# Patient Record
Sex: Male | Born: 1955 | Race: Black or African American | Hispanic: No | Marital: Single | Smoking: Current every day smoker
Health system: Southern US, Community
[De-identification: ages and names within clinical notes are randomized; demographics above are authoritative.]

## PROBLEM LIST (undated history)

## (undated) DIAGNOSIS — I10 Essential (primary) hypertension: Secondary | ICD-10-CM

## (undated) DIAGNOSIS — M25512 Pain in left shoulder: Secondary | ICD-10-CM

## (undated) DIAGNOSIS — T7840XA Allergy, unspecified, initial encounter: Secondary | ICD-10-CM

## (undated) DIAGNOSIS — M549 Dorsalgia, unspecified: Secondary | ICD-10-CM

## (undated) HISTORY — PX: HEMORRHOID SURGERY: SHX153

## (undated) HISTORY — DX: Pain in left shoulder: M25.512

## (undated) HISTORY — PX: CIRCUMCISION: SUR203

## (undated) HISTORY — DX: Allergy, unspecified, initial encounter: T78.40XA

---

## 2012-05-03 ENCOUNTER — Encounter (HOSPITAL_BASED_OUTPATIENT_CLINIC_OR_DEPARTMENT_OTHER): Payer: Self-pay

## 2012-05-03 ENCOUNTER — Emergency Department (HOSPITAL_BASED_OUTPATIENT_CLINIC_OR_DEPARTMENT_OTHER)
Admission: EM | Admit: 2012-05-03 | Discharge: 2012-05-03 | Disposition: A | Discharge status: Home / Self Care | Payer: Self-pay | Attending: Emergency Medicine | Admitting: Emergency Medicine

## 2012-05-03 DIAGNOSIS — R209 Unspecified disturbances of skin sensation: Secondary | ICD-10-CM | POA: Insufficient documentation

## 2012-05-03 DIAGNOSIS — F17200 Nicotine dependence, unspecified, uncomplicated: Secondary | ICD-10-CM | POA: Insufficient documentation

## 2012-05-03 DIAGNOSIS — M542 Cervicalgia: Secondary | ICD-10-CM | POA: Insufficient documentation

## 2012-05-03 MED ORDER — HYDROCODONE-ACETAMINOPHEN 5-325 MG PO TABS: 2.0000 | ORAL_TABLET | ORAL | Status: DC | PRN

## 2012-05-03 MED ORDER — KETOROLAC TROMETHAMINE 60 MG/2ML IM SOLN
60.0000 mg | Freq: Once | INTRAMUSCULAR | Status: AC
  Administered 2012-05-03: 60 mg via INTRAMUSCULAR
  Filled 2012-05-03: qty 2

## 2012-05-03 MED ORDER — METHOCARBAMOL 500 MG PO TABS: 500.0000 mg | ORAL_TABLET | Freq: Two times a day (BID) | ORAL | Status: DC

## 2012-05-03 MED ORDER — METHYLPREDNISOLONE SODIUM SUCC 125 MG IJ SOLR
125.0000 mg | Freq: Once | INTRAMUSCULAR | Status: AC
  Administered 2012-05-03: 125 mg via INTRAMUSCULAR
  Filled 2012-05-03: qty 2

## 2012-05-03 MED ORDER — IBUPROFEN 800 MG PO TABS: 800.0000 mg | ORAL_TABLET | Freq: Three times a day (TID) | ORAL | Status: DC

## 2012-05-03 NOTE — ED Provider Notes (Signed)
Medical screening examination/treatment/procedure(s) were performed by non-physician practitioner and as supervising physician I was immediately available for consultation/collaboration.   Hurman Horn, MD 05/03/12 [phone]

## 2012-05-03 NOTE — ED Notes (Signed)
Pt reports upper back pain radiating to left arm x 4 weeks. He also reports numbness in 4th and 5th digit of left hand.

## 2012-05-03 NOTE — ED Provider Notes (Signed)
History     CSN: [id number]  Arrival date & time 05/03/12  1123   First MD Initiated Contact with Patient 05/03/12 1234      Chief Complaint  Patient presents with  . Back Pain    (Consider location/radiation/quality/duration/timing/severity/associated sxs/prior treatment) Patient is a 57 y.o. male presenting with neck injury. The history is provided by the patient. No language interpreter was used.  Neck Injury This is a new problem. Episode onset: 6 weeks. The problem occurs constantly. Associated symptoms include neck pain. Nothing aggravates the symptoms. He has tried nothing for the symptoms.   Pt reports he has had neck pain for over a year.  Pt was seen at [facility] ED and advised to follow up with Orthopaedist.  Pt reports some decreased sensation left 4th adn 5th finger.  History reviewed. No pertinent past medical history.  History reviewed. No pertinent past surgical history.  No family history on file.  History  Substance Use Topics  . Smoking status: Current Every Day Smoker -- 0.50 packs/day    Types: Cigarettes  . Smokeless tobacco: Not on file  . Alcohol Use: Yes     Comment: weekends      Review of Systems  HENT: Positive for neck pain.   All other systems reviewed and are negative.    Allergies  Review of patient's allergies indicates no known allergies.  Home Medications   Current Outpatient Rx  Name  Route  Sig  Dispense  Refill  . HYDROcodone-acetaminophen (NORCO/VICODIN) 5-325 MG per tablet   Oral   Take 2 tablets by mouth every 4 (four) hours as needed.   16 tablet   0   . ibuprofen (ADVIL,MOTRIN) 800 MG tablet   Oral   Take 1 tablet (800 mg total) by mouth 3 (three) times daily.   21 tablet   0   . methocarbamol (ROBAXIN) 500 MG tablet   Oral   Take 1 tablet (500 mg total) by mouth 2 (two) times daily.   20 tablet   0     BP 145/73  Pulse 70  Temp(Src) 98.1 F (36.7 C) (Oral)  Resp 18  Ht 5\' 8"  (1.727 m)  Wt 138 lb  (62.596 kg)  BMI 20.99 kg/m2  SpO2 100%  Physical Exam  Nursing note and vitals reviewed. Constitutional: He is oriented to person, place, and time. He appears well-developed and well-nourished.  HENT:  Head: Normocephalic.  Right Ear: External ear normal.  Left Ear: External ear normal.  Nose: Nose normal.  Mouth/Throat: Oropharynx is clear and moist.  Eyes: Conjunctivae and EOM are normal. Pupils are equal, round, and reactive to light.  Neck: Normal range of motion. Neck supple.  Cardiovascular: Normal rate and normal heart sounds.   Pulmonary/Chest: Effort normal.  Abdominal: Soft.  Musculoskeletal: Normal range of motion.  Neurological: He is alert and oriented to person, place, and time. He has normal reflexes.  Skin: Skin is warm.  Psychiatric: He has a normal mood and affect.    ED Course  Procedures (including critical care time)  Labs Reviewed - No data to display No results found.   1. Neck pain on left side       MDM  Pt advised to call adult care clinic to be seen,   Pt given solumedrol and torodol Gerrianne Scale, PA-C 05/03/12 1356

## 2012-11-07 ENCOUNTER — Emergency Department (HOSPITAL_BASED_OUTPATIENT_CLINIC_OR_DEPARTMENT_OTHER)
Admission: EM | Admit: 2012-11-07 | Discharge: 2012-11-07 | Disposition: A | Discharge status: Home / Self Care | Payer: Self-pay | Attending: Emergency Medicine | Admitting: Emergency Medicine

## 2012-11-07 ENCOUNTER — Encounter (HOSPITAL_BASED_OUTPATIENT_CLINIC_OR_DEPARTMENT_OTHER): Payer: Self-pay | Admitting: Emergency Medicine

## 2012-11-07 DIAGNOSIS — R498 Other voice and resonance disorders: Secondary | ICD-10-CM | POA: Insufficient documentation

## 2012-11-07 DIAGNOSIS — Z791 Long term (current) use of non-steroidal anti-inflammatories (NSAID): Secondary | ICD-10-CM | POA: Insufficient documentation

## 2012-11-07 DIAGNOSIS — Z79899 Other long term (current) drug therapy: Secondary | ICD-10-CM | POA: Insufficient documentation

## 2012-11-07 DIAGNOSIS — T782XXA Anaphylactic shock, unspecified, initial encounter: Secondary | ICD-10-CM | POA: Insufficient documentation

## 2012-11-07 DIAGNOSIS — I10 Essential (primary) hypertension: Secondary | ICD-10-CM | POA: Insufficient documentation

## 2012-11-07 DIAGNOSIS — R21 Rash and other nonspecific skin eruption: Secondary | ICD-10-CM | POA: Insufficient documentation

## 2012-11-07 DIAGNOSIS — F17200 Nicotine dependence, unspecified, uncomplicated: Secondary | ICD-10-CM | POA: Insufficient documentation

## 2012-11-07 MED ORDER — EPINEPHRINE 0.3 MG/0.3ML IJ SOAJ: 0.3000 mg | INTRAMUSCULAR | Status: AC | PRN

## 2012-11-07 MED ORDER — EPINEPHRINE HCL 1 MG/ML IJ SOLN
INTRAMUSCULAR | Status: AC
  Administered 2012-11-07: 0.3 mg
  Filled 2012-11-07: qty 1

## 2012-11-07 MED ORDER — DIPHENHYDRAMINE HCL 50 MG/ML IJ SOLN
INTRAMUSCULAR | Status: AC
  Administered 2012-11-07: 50 mg
  Filled 2012-11-07: qty 1

## 2012-11-07 MED ORDER — SODIUM CHLORIDE 0.9 % IV BOLUS (SEPSIS)
1000.0000 mL | Freq: Once | INTRAVENOUS | Status: AC
  Administered 2012-11-07: 1000 mL via INTRAVENOUS

## 2012-11-07 MED ORDER — DIPHENHYDRAMINE HCL 25 MG PO TABS: 50.0000 mg | ORAL_TABLET | ORAL | Status: AC | PRN

## 2012-11-07 MED ORDER — FAMOTIDINE IN NACL 20-0.9 MG/50ML-% IV SOLN
20.0000 mg | Freq: Once | INTRAVENOUS | Status: DC
  Administered 2012-11-07: 20 mg via INTRAVENOUS

## 2012-11-07 MED ORDER — FAMOTIDINE IN NACL 20-0.9 MG/50ML-% IV SOLN
INTRAVENOUS | Status: AC
  Filled 2012-11-07: qty 50

## 2012-11-07 MED ORDER — EPINEPHRINE 0.3 MG/0.3ML IJ SOAJ
0.3000 mg | Freq: Once | INTRAMUSCULAR | Status: DC
  Filled 2012-11-07: qty 0.6

## 2012-11-07 MED ORDER — PREDNISONE 20 MG PO TABS: ORAL_TABLET | ORAL | Status: DC

## 2012-11-07 MED ORDER — METHYLPREDNISOLONE SODIUM SUCC 125 MG IJ SOLR
INTRAMUSCULAR | Status: AC
  Administered 2012-11-07: 125 mg
  Filled 2012-11-07: qty 2

## 2012-11-07 NOTE — ED Provider Notes (Signed)
CSN: [id number]     Arrival date & time 11/07/12  1104 History   First MD Initiated Contact with Patient 11/07/12 1106     Chief Complaint  Patient presents with  . Allergic Reaction   (Consider location/radiation/quality/duration/timing/severity/associated sxs/prior Treatment) HPI Comments: 57 year old male comes in with acute tongue swelling. States it started about 30 minutes prior to arrival. He denies any shortness of breath or wheezing. 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 He was working outside when this started. His significant other in the room states that this is the most severe reactions ever had. He denies any current medications or new foods. He was having itchiness and hives on his buttocks prior to arrival. No other systemic signs.   History reviewed. No pertinent past medical history. History reviewed. No pertinent past surgical history. History reviewed. No pertinent family history. History  Substance Use Topics  . Smoking status: Current Every Day Smoker -- 0.50 packs/day    Types: Cigarettes  . Smokeless tobacco: Not on file  . Alcohol Use: Yes     Comment: weekends    Review of Systems  Constitutional: Negative for fever and chills.  HENT: Positive for voice change.        Tongue swelling  Respiratory: Negative for chest tightness, shortness of breath and wheezing.   Cardiovascular: Negative for chest pain.  Gastrointestinal: Negative for vomiting.  Skin: Positive for rash (hives on buttocks).  All other systems reviewed and are negative.    Allergies  Review of patient's allergies indicates no known allergies.  Home Medications   Current Outpatient Rx  Name  Route  Sig  Dispense  Refill  . HYDROcodone-acetaminophen (NORCO/VICODIN) 5-325 MG per tablet   Oral   Take 2 tablets by mouth every 4 (four) hours as needed.   16 tablet   0   . ibuprofen (ADVIL,MOTRIN) 800 MG  tablet   Oral   Take 1 tablet (800 mg total) by mouth 3 (three) times daily.   21 tablet   0   . methocarbamol (ROBAXIN) 500 MG tablet   Oral   Take 1 tablet (500 mg total) by mouth 2 (two) times daily.   20 tablet   0    BP 153/68  Pulse 67  Resp 14  SpO2 100% Physical Exam  Nursing note and vitals reviewed. Constitutional: He is oriented to person, place, and time. He appears well-developed and well-nourished. No distress.  HENT:  Head: Normocephalic and atraumatic.  Right Ear: External ear normal.  Left Ear: External ear normal.  Nose: Nose normal.  Mouth/Throat: No trismus in the jaw.  Diffuse tongue and base of tongue swelling  Eyes: Right eye exhibits no discharge. Left eye exhibits no discharge.  Neck: Neck supple.  Cardiovascular: Normal rate, regular rhythm, normal heart sounds and intact distal pulses.   Pulmonary/Chest: Effort normal and breath sounds normal. No stridor. He has no wheezes.  Abdominal: Soft. There is no tenderness.  Musculoskeletal: He exhibits no edema.  Neurological: He is alert and oriented to person, place, and time.  Skin: Skin is warm and dry.    ED Course  Procedures (including critical care time) Labs Review Labs Reviewed - No data to display Imaging Review No results found.  EKG Interpretation   None       MDM   1. Anaphylaxis, initial encounter    Patient has neuropathy  on hand from neck problems so he feels he did not feel a bee sting but brushed a bee away just prior to this. Tongue returned to normal size after benadryl, solumedrol, pepcid, and epipen. He never had any throat sx or airway distress. Watched in ED 4 hours after epi and remained asymptomatic. Will d/c with benadryl, solumedrol and epipens (he is out). Discussed strict return precautions.    Audree Camel, MD 11/07/12 1710

## 2012-11-07 NOTE — ED Notes (Signed)
Pts tongue swelling states thinks a bee may have stung him but doesn't know for sure didn't feel it. 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

## 2012-11-07 NOTE — ED Notes (Signed)
Swelling is improving. Pt has no complaints.

## 2012-11-07 NOTE — ED Notes (Signed)
Family at bedside. 

## 2012-11-07 NOTE — ED Notes (Signed)
Pt. Is able to swallow and the Pt. Tongue is WNL at present time.  NO complaints from Pt.

## 2012-11-28 ENCOUNTER — Encounter: Payer: Self-pay | Admitting: Physician Assistant

## 2012-11-28 ENCOUNTER — Ambulatory Visit (INDEPENDENT_AMBULATORY_CARE_PROVIDER_SITE_OTHER): Payer: Self-pay | Admitting: Physician Assistant

## 2012-11-28 VITALS — BP 130/88 | HR 72 | Temp 98.5°F | Ht 68.0 in | Wt 139.0 lb

## 2012-11-28 DIAGNOSIS — M542 Cervicalgia: Secondary | ICD-10-CM

## 2012-11-28 DIAGNOSIS — N529 Male erectile dysfunction, unspecified: Secondary | ICD-10-CM

## 2012-11-28 DIAGNOSIS — Z0000 Encounter for general adult medical examination without abnormal findings: Secondary | ICD-10-CM

## 2012-11-28 DIAGNOSIS — M25519 Pain in unspecified shoulder: Secondary | ICD-10-CM

## 2012-11-28 DIAGNOSIS — M25512 Pain in left shoulder: Secondary | ICD-10-CM

## 2012-11-28 LAB — CBC WITH DIFFERENTIAL/PLATELET
Basophils Relative: 0 % (ref 0–1)
HCT: 46.1 % (ref 39.0–52.0)
Hemoglobin: 16.6 g/dL (ref 13.0–17.0)
Lymphs Abs: 2.3 10*3/uL (ref 0.7–4.0)
MCHC: 36 g/dL (ref 30.0–36.0)
Monocytes Absolute: 0.6 10*3/uL (ref 0.1–1.0)
Monocytes Relative: 8 % (ref 3–12)
Neutro Abs: 3.9 10*3/uL (ref 1.7–7.7)
Neutrophils Relative %: 56 % (ref 43–77)
RBC: 5.1 MIL/uL (ref 4.22–5.81)

## 2012-11-28 LAB — BASIC METABOLIC PANEL
BUN: 12 mg/dL (ref 6–23)
CO2: 27 mEq/L (ref 19–32)
Chloride: 99 mEq/L (ref 96–112)
Glucose, Bld: 96 mg/dL (ref 70–99)
Potassium: 4.5 mEq/L (ref 3.5–5.3)
Sodium: 135 mEq/L (ref 135–145)

## 2012-11-28 LAB — HEPATIC FUNCTION PANEL
ALT: 14 U/L (ref 0–53)
AST: 18 U/L (ref 0–37)
Albumin: 4.8 g/dL (ref 3.5–5.2)
Alkaline Phosphatase: 70 U/L (ref 39–117)
Total Bilirubin: 0.9 mg/dL (ref 0.3–1.2)
Total Protein: 7.8 g/dL (ref 6.0–8.3)

## 2012-11-28 LAB — TSH: TSH: 1.005 u[IU]/mL (ref 0.350–4.500)

## 2012-11-28 LAB — LIPID PANEL
Cholesterol: 158 mg/dL (ref 0–200)
LDL Cholesterol: 73 mg/dL (ref 0–99)
Total CHOL/HDL Ratio: 2.9 Ratio
VLDL: 30 mg/dL (ref 0–40)

## 2012-11-28 MED ORDER — CYCLOBENZAPRINE HCL 10 MG PO TABS: ORAL_TABLET | ORAL | Status: AC

## 2012-11-28 MED ORDER — NAPROXEN 500 MG PO TABS: 500.0000 mg | ORAL_TABLET | Freq: Two times a day (BID) | ORAL | Status: DC

## 2012-11-28 NOTE — Progress Notes (Signed)
Patient ID: Chad Beltran, male   DOB: 07-19-1955, 57 y.o.   MRN: [id number]  Patient presents to clinic today to establish care.  Acute Concerns: Back pain -- 2 years X-ray on back.  Sent to Orthopedist -- could not afford to see specialist.  Thoracic pain -- starts throbbing and builds to a sharp pain.  States pain is left-sided and causes numbness in his 4th/5th phalanges of left hand.  Patient also notes pain when trying to abduct his left arm.  Patient states he can rarely get his arm up over his head and has difficulty putting on/taking off shirts.  Patient also c/o erectile dysfunction that has been progressively worsening over the past few years.  Endorses difficulty achieving and maintaining erection sufficient enough for penetration. Patient is a curretn 0.5 ppd smoker.  Patient denies history of HTN, HLD, CAD, or diabetes.  Chronic Issues: Allergy to  [place] -- Patient had anaphylactic reaction earlier this year after being stung by a bee.  Was rushed to the ER.  Patient currently has 2 epi pens and keeps one nearby.  Health Maintenance: Dental -- overdue. Vision -- UTD Immunizations -- reports UTD.  Patient declines flu shot. Colonoscopy -- Patient overdue for colonoscopy.  Past Medical History  Diagnosis Date  . Allergy     Current Outpatient Prescriptions on File Prior to Visit  Medication Sig Dispense Refill  . diphenhydrAMINE (BENADRYL) 25 MG tablet Take 2 tablets (50 mg total) by mouth every 4 (four) hours as needed for itching.  20 tablet  0  . EPINEPHrine (EPIPEN) 0.3 mg/0.3 mL SOAJ injection Inject 0.3 mLs (0.3 mg total) into the muscle as needed.  2 Device  1  . ibuprofen (ADVIL,MOTRIN) 800 MG tablet Take 1 tablet (800 mg total) by mouth 3 (three) times daily.  21 tablet  0   No current facility-administered medications on file prior to visit.    No Known Allergies  Family History  Problem Relation Age of Onset  . Hypertension Mother   . Hypertension  Maternal Uncle     History   Social History  . Marital Status: Single    Spouse Name: N/A    Number of Children: N/A  . Years of Education: N/A   Social History Main Topics  . Smoking status: Current Every Day Smoker -- 0.50 packs/day for 25 years    Types: Cigarettes  . Smokeless tobacco: Never Used  . Alcohol Use: Yes     Comment: weekends  . Drug Use: No  . Sexual Activity: Yes   Other Topics Concern  . None   Social History Narrative  . None   Review of Systems  Constitutional: Negative for fever, weight loss and malaise/fatigue.  HENT: Negative for ear discharge, ear pain, hearing loss and tinnitus.   Eyes: Negative for blurred vision, double vision, photophobia and pain.  Respiratory: Negative for cough, shortness of breath and wheezing.   Cardiovascular: Negative for chest pain and palpitations.  Gastrointestinal: Negative for heartburn, nausea, vomiting, abdominal pain, diarrhea, constipation, blood in stool and melena.  Genitourinary: Negative for dysuria, urgency, frequency, hematuria and flank pain.  Musculoskeletal: Positive for back pain. Negative for falls.  Neurological: Negative for dizziness, seizures, loss of consciousness and headaches.  Endo/Heme/Allergies: Negative for environmental allergies.  Psychiatric/Behavioral: Negative for depression, suicidal ideas, hallucinations and substance abuse. The patient is not nervous/anxious and does not have insomnia.    Filed Vitals:   11/28/12 1018  BP: 130/88  Pulse: 72  Temp: 98.5 F (36.9 C)    Physical Exam  Vitals reviewed. Constitutional: He is oriented to person, place, and time and well-developed, well-nourished, and in no distress.  HENT:  Head: Normocephalic and atraumatic.  Right Ear: External ear normal.  Left Ear: External ear normal.  Nose: Nose normal.  Mouth/Throat: Oropharynx is clear and moist. No oropharyngeal exudate.  Tympanic membranes within normal limits bilaterally.  Eyes:  Conjunctivae and EOM are normal. Pupils are equal, round, and reactive to light.  Neck: Normal range of motion. Neck supple. No thyromegaly present.  Cardiovascular: Normal rate, regular rhythm, normal heart sounds and intact distal pulses.   Pulmonary/Chest: Effort normal and breath sounds normal. No respiratory distress. He has no wheezes. He has no rales. He exhibits no tenderness.  Abdominal: Soft. Bowel sounds are normal. He exhibits no distension and no mass. There is no tenderness. There is no rebound and no guarding.  Genitourinary: Penis normal.  Musculoskeletal:       Left shoulder: He exhibits decreased range of motion, tenderness, pain, spasm and decreased strength. He exhibits no bony tenderness, no swelling, no effusion, no crepitus, no deformity and normal pulse.       Left elbow: Normal.       Left wrist: Normal.       Cervical back: He exhibits normal range of motion, no tenderness, no bony tenderness, no swelling and no edema.       Left upper arm: He exhibits no tenderness, no bony tenderness, no swelling, no edema, no deformity and no laceration.       Left forearm: Normal.       Left hand: Normal.  Lymphadenopathy:    He has no cervical adenopathy.  Neurological: He is alert and oriented to person, place, and time. He has intact cranial nerves. No cranial nerve deficit.  Decreased strength of LUE noted on exam. Strength is 5/5 elsewhere.  Numbness of 4th/5th phalanx on left hand noted on exam.  Otherwise sensory wnl.  Skin: Skin is warm and dry. No rash noted.  Psychiatric: Affect normal.   Assessment/Plan: Visit for preventive health examination Will obtain fasting labs.  Will make referral to Gastroenterology for colonoscopy.  Neck pain on left side Dg cervical spine.  Patient given Naprosyn for Shoulder pain that should help with neck pain.  Pain in joint, shoulder region Naprosyn.  Flexeril at bedtime.  Referral to Dr. Katrinka Blazing for [place].  Erectile dysfunction Exam  WNL. Will obtain CBC, BMP, PSA.  Will begin trial of viagra pending normal results.

## 2012-11-28 NOTE — Progress Notes (Signed)
Pre visit review using our clinic review tool, if applicable. No additional management support is needed unless otherwise documented below in the visit note. 

## 2012-11-28 NOTE — Patient Instructions (Addendum)
Please obtain labs and imaging.  I will call you with your results.  Please take medication as prescribed for pain.  Please apply a topical salon pas patch to back to help with pain relief.  Please stop by front desk to schedule appointment with Dr. Katrinka Blazing.

## 2012-11-29 ENCOUNTER — Telehealth: Payer: Self-pay | Admitting: Physician Assistant

## 2012-11-29 ENCOUNTER — Encounter: Payer: Self-pay | Admitting: Gastroenterology

## 2012-11-29 DIAGNOSIS — M542 Cervicalgia: Secondary | ICD-10-CM | POA: Insufficient documentation

## 2012-11-29 DIAGNOSIS — N529 Male erectile dysfunction, unspecified: Secondary | ICD-10-CM | POA: Insufficient documentation

## 2012-11-29 DIAGNOSIS — Z0000 Encounter for general adult medical examination without abnormal findings: Secondary | ICD-10-CM | POA: Insufficient documentation

## 2012-11-29 DIAGNOSIS — M25519 Pain in unspecified shoulder: Secondary | ICD-10-CM | POA: Insufficient documentation

## 2012-11-29 LAB — URINALYSIS, ROUTINE W REFLEX MICROSCOPIC
Hgb urine dipstick: NEGATIVE
Ketones, ur: NEGATIVE mg/dL
Nitrite: NEGATIVE
Protein, ur: NEGATIVE mg/dL
Specific Gravity, Urine: 1.023 (ref 1.005–1.030)
Urobilinogen, UA: 1 mg/dL (ref 0.0–1.0)

## 2012-11-29 MED ORDER — SILDENAFIL CITRATE 50 MG PO TABS: 50.0000 mg | ORAL_TABLET | Freq: Every day | ORAL | Status: AC | PRN

## 2012-11-29 NOTE — Assessment & Plan Note (Signed)
Will obtain fasting labs.  Will make referral to Gastroenterology for colonoscopy.

## 2012-11-29 NOTE — Telephone Encounter (Signed)
Patient informed, understood & agreed; will call back to schedule f/u appt/SLS

## 2012-11-29 NOTE — Assessment & Plan Note (Signed)
Exam WNL. Will obtain CBC, BMP, PSA.  Will begin trial of viagra pending normal results.

## 2012-11-29 NOTE — Telephone Encounter (Signed)
Please inform patient that his labs look great.  I have no problem starting him on Viagra.  We will start on 50 mg of Viagra for patient to take 30 min - 2 hours before desired intercourse.  Patient is not to take more than 1 dose in a 24-hour period.  Common side effects of viagra can include -- headache, lowered blood pressure, dizziness, nausea.  If patient experiences an erection lasting for more than 4 hours, he needs to go to the ER.  (This is a rare side effect). Patient currently smokes less than 1/2 pack/ day.  He should be warned that smoking increases likelihood of side effects while taking Viagra, and can make the medicine less effective.  If patient achieves desired result with 50 mg, he can attempt to take a 1/2 tablet (25 mg) the next time to see if he still gets the desired effect with a lower dosage of medicine.  I want to see him in 1 month for follow-up.

## 2012-11-29 NOTE — Assessment & Plan Note (Addendum)
Dg cervical spine.  Patient given Naprosyn for Shoulder pain that should help with neck pain.

## 2012-11-29 NOTE — Assessment & Plan Note (Signed)
Naprosyn.  Flexeril at bedtime.  Referral to Dr. Katrinka Blazing for [place].

## 2012-11-29 NOTE — Addendum Note (Signed)
Addended by: Marcelline Mates on: 11/29/2012 09:02 AM   Modules accepted: Orders

## 2012-12-05 ENCOUNTER — Other Ambulatory Visit (INDEPENDENT_AMBULATORY_CARE_PROVIDER_SITE_OTHER): Payer: Self-pay

## 2012-12-05 ENCOUNTER — Ambulatory Visit (INDEPENDENT_AMBULATORY_CARE_PROVIDER_SITE_OTHER)
Admission: RE | Admit: 2012-12-05 | Discharge: 2012-12-05 | Disposition: A | Discharge status: Home / Self Care | Payer: Self-pay | Source: Ambulatory Visit | Attending: Family Medicine | Admitting: Family Medicine

## 2012-12-05 ENCOUNTER — Encounter: Payer: Self-pay | Admitting: Family Medicine

## 2012-12-05 ENCOUNTER — Ambulatory Visit (INDEPENDENT_AMBULATORY_CARE_PROVIDER_SITE_OTHER): Payer: Self-pay | Admitting: Family Medicine

## 2012-12-05 VITALS — BP 126/72 | HR 100 | Wt 147.0 lb

## 2012-12-05 DIAGNOSIS — M25512 Pain in left shoulder: Secondary | ICD-10-CM

## 2012-12-05 DIAGNOSIS — M25519 Pain in unspecified shoulder: Secondary | ICD-10-CM

## 2012-12-05 DIAGNOSIS — M5412 Radiculopathy, cervical region: Secondary | ICD-10-CM

## 2012-12-05 DIAGNOSIS — M5010 Cervical disc disorder with radiculopathy, unspecified cervical region: Secondary | ICD-10-CM | POA: Insufficient documentation

## 2012-12-05 MED ORDER — GABAPENTIN 100 MG PO CAPS: 300.0000 mg | ORAL_CAPSULE | Freq: Every day | ORAL | Status: AC

## 2012-12-05 MED ORDER — KETOROLAC TROMETHAMINE 60 MG/2ML IM SOLN
60.0000 mg | Freq: Once | INTRAMUSCULAR | Status: AC
  Administered 2012-12-05: 60 mg via INTRAMUSCULAR

## 2012-12-05 NOTE — Assessment & Plan Note (Signed)
Patient does have some cervical radiculopathy. X-rays were ordered, reviewed and interpreted by me today. Patient does have several degenerative disc on multiple levels from C3 all the way to T1. I do think that this is contributing likely is giving him his radiculopathy to the left shoulder. Patient did tolerate the procedure well with trigger point injections. Patient was given home exercise program which I think will be beneficial. Patient also will start gabapentin at night and will slowly increase. We likely will not do any during the day if possible. Patient will start a home exercise program a regular basis. Patient will come back again in 3 weeks for further evaluation. We did discuss with patient that he does not have insurance he may be able to get supplemental from [place], hospital. Patient unfortunately worsened over the course of time he may need further imaging. Hopefully he'll just continue to improve.

## 2012-12-05 NOTE — Progress Notes (Signed)
Pre-visit discussion using our clinic review tool. No additional management support is needed unless otherwise documented below in the visit note.  

## 2012-12-05 NOTE — Patient Instructions (Addendum)
Nice to meet you I think the pain is from your neck.  We will get xrays today. I think we need to know about it.  Call [facility] hospital and ask about [facility] card, they may help you with some tests.  Try nuerontin at night. Take 1 pill nightly for 3 nights, then 2 pills nightly for 3 nights and then 3 pills nightly therafter.  Do exercises daily  Come back in 3 weeks.

## 2012-12-05 NOTE — Addendum Note (Signed)
Addended by: Edwena Felty T on: 12/05/2012 01:15 PM   Modules accepted: Orders

## 2012-12-05 NOTE — Progress Notes (Signed)
I'm seeing this patient by the request  of:  Piedad Climes, PA-C  CC: Neck and left shoulder pain  HPI: Patient is a pleasant 57 year old gentleman coming in with neck and left shoulder pain. Patient has had this pain for quite some time but seems to be worsening over the course of the last couple months. 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 Patient unfortunately just lost his job last week. Patient states that the pain seems to always originate near his neck but does radiate to the left shoulder. Patient notices more pain when he moves his neck certain ways as well as her shoulder. Patient denies any radiation down the arm, denies any numbness or tingling but feels that this arm is getting somewhat week. Patient states that the pain is so severe that it is keeping him up at night. This is affecting all activities of daily living. Patient rates the pain a 9/10 on severity scale. Patient has been given muscle relaxers without any significant improvement.   Past medical, surgical, family and social history reviewed. Medications reviewed all in the electronic medical record.   Review of Systems: No headache, visual changes, nausea, vomiting, diarrhea, constipation, dizziness, abdominal pain, skin rash, fevers, chills, night sweats, weight loss, swollen lymph nodes, body aches, joint swelling, muscle aches, chest pain, shortness of breath, mood changes.   Objective:    Blood pressure 126/72, pulse 100, weight 147 lb (66.679 kg), SpO2 99.00%.   General: No apparent distress alert and oriented x3 mood and affect normal, dressed appropriately.  HEENT: Pupils equal, extraocular movements intact Respiratory: Patient's speak in full sentences and does not appear short of breath Cardiovascular: No lower extremity edema, non tender, no erythema Skin: Warm dry intact with no signs of infection or rash on extremities or on axial  skeleton. Abdomen: Soft nontender Neuro: Cranial nerves II through XII are intact, neurovascularly intact in all extremities with 2+ DTRs and 2+ pulses. Lymph: No lymphadenopathy of posterior or anterior cervical chain or axillae bilaterally.  Gait normal with good balance and coordination.  MSK: Non tender with full range of motion and good stability and symmetric strength and tone of elbows, wrist, hip, knee and ankles bilaterally.  Neck: Inspection shows patient does have some increased lordosis No palpable stepoffs. Positive Spurling's maneuver on the left. Patient has decreased range of motion with extension as well as left side bending Grip strength and sensation normal in bilateral hands Strength good C4 to T1 distribution No sensory change to C4 to T1 Negative Hoffman sign bilaterally Reflexes normal The patient does have significant tightness and muscle spasm with trigger points of the left trapezius muscle. 2 very large trigger points noted.  Shoulder: Left Inspection reveals no abnormalities, atrophy or asymmetry. Palpation is normal with no tenderness over AC joint or bicipital groove. ROM is full in all planes. Rotator cuff strength normal throughout. No signs of impingement with negative Neer and Hawkin's tests, empty can sign. Speeds and Yergason's tests normal. No labral pathology noted with negative Obrien's, negative clunk and good stability. Normal scapular function observed. No painful arc and no drop arm sign. No apprehension sign  Procedure note After verbal and written consent patient was prepped with alcohol swabs and a 26-gauge half inch needle was inserted in a trigger point of the left trapezius. Patient did have injection of 0.5% Marcaine one mL of Kenalog 40 mg/dL. Patient had this repeated in other trigger point.  Patient tolerated the procedure well with significant decrease in pain immediately.   Impression and Recommendations:     This case required  medical decision making of moderate complexity.

## 2012-12-31 ENCOUNTER — Ambulatory Visit (AMBULATORY_SURGERY_CENTER): Payer: Self-pay

## 2012-12-31 ENCOUNTER — Ambulatory Visit: Payer: Self-pay | Admitting: Gastroenterology

## 2012-12-31 VITALS — Ht 68.0 in | Wt 138.6 lb

## 2012-12-31 DIAGNOSIS — Z1211 Encounter for screening for malignant neoplasm of colon: Secondary | ICD-10-CM

## 2012-12-31 MED ORDER — MOVIPREP 100 G PO SOLR: ORAL | Status: DC

## 2013-01-14 ENCOUNTER — Encounter: Payer: Self-pay | Admitting: Gastroenterology

## 2013-01-14 ENCOUNTER — Ambulatory Visit (AMBULATORY_SURGERY_CENTER): Payer: Self-pay | Admitting: Gastroenterology

## 2013-01-14 VITALS — BP 107/62 | HR 67 | Temp 96.9°F | Resp 15 | Ht 68.0 in | Wt 138.0 lb

## 2013-01-14 DIAGNOSIS — D126 Benign neoplasm of colon, unspecified: Secondary | ICD-10-CM

## 2013-01-14 DIAGNOSIS — D128 Benign neoplasm of rectum: Secondary | ICD-10-CM

## 2013-01-14 DIAGNOSIS — Z1211 Encounter for screening for malignant neoplasm of colon: Secondary | ICD-10-CM

## 2013-01-14 MED ORDER — SODIUM CHLORIDE 0.9 % IV SOLN: 500.0000 mL | INTRAVENOUS | Status: DC

## 2013-01-14 NOTE — Patient Instructions (Signed)
Discharge instructions given with verbal understanding. Handouts on polyps and hemorrhoids. Resume previous medications. YOU HAD AN ENDOSCOPIC PROCEDURE TODAY AT THE [facility]: Refer to the procedure report that was given to you for any specific questions about what was found during the examination.  If the procedure report does not answer your questions, please call your gastroenterologist to clarify.  If you requested that your care partner not be given the details of your procedure findings, then the procedure report has been included in a sealed envelope for you to review at your convenience later.  YOU SHOULD EXPECT: Some feelings of bloating in the abdomen. Passage of more gas than usual.  Walking can help get rid of the air that was put into your GI tract during the procedure and reduce the bloating. If you had a lower endoscopy (such as a colonoscopy or flexible sigmoidoscopy) you may notice spotting of blood in your stool or on the toilet paper. If you underwent a bowel prep for your procedure, then you may not have a normal bowel movement for a few days.  DIET: Your first meal following the procedure should be a light meal and then it is ok to progress to your normal diet.  A half-sandwich or bowl of soup is an example of a good first meal.  Heavy or fried foods are harder to digest and may make you feel nauseous or bloated.  Likewise meals heavy in dairy and vegetables can cause extra gas to form and this can also increase the bloating.  Drink plenty of fluids but you should avoid alcoholic beverages for 24 hours.  ACTIVITY: Your care partner should take you home directly after the procedure.  You should plan to take it easy, moving slowly for the rest of the day.  You can resume normal activity the day after the procedure however you should NOT DRIVE or use heavy machinery for 24 hours (because of the sedation medicines used during the test).    SYMPTOMS TO REPORT  IMMEDIATELY: A gastroenterologist can be reached at any hour.  During normal business hours, 8:30 AM to 5:00 PM Monday through Friday, call [phone].  After hours and on weekends, please call the GI answering service at [phone] who will take a message and have the physician on call contact you.   Following lower endoscopy (colonoscopy or flexible sigmoidoscopy):  Excessive amounts of blood in the stool  Significant tenderness or worsening of abdominal pains  Swelling of the abdomen that is new, acute  Fever of 100F or higher  FOLLOW UP: If any biopsies were taken you will be contacted by phone or by letter within the next 1-3 weeks.  Call your gastroenterologist if you have not heard about the biopsies in 3 weeks.  Our staff will call the home number listed on your records the next business day following your procedure to check on you and address any questions or concerns that you may have at that time regarding the information given to you following your procedure. This is a courtesy call and so if there is no answer at the home number and we have not heard from you through the emergency physician on call, we will assume that you have returned to your regular daily activities without incident.  SIGNATURES/CONFIDENTIALITY: You and/or your care partner have signed paperwork which will be entered into your electronic medical record.  These signatures attest to the fact that that the information above on your After Visit Summary   has been reviewed and is understood.  Full responsibility of the confidentiality of this discharge information lies with you and/or your care-partner. 

## 2013-01-14 NOTE — Progress Notes (Signed)
Patient did not experience any of the following events: a burn prior to discharge; a fall within the facility; wrong site/side/patient/procedure/implant event; or a hospital transfer or hospital admission upon discharge from the facility. (G8907) Patient did not have preoperative order for IV antibiotic SSI prophylaxis. (G8918)  

## 2013-01-14 NOTE — Op Note (Signed)
[facility] [address] [place], [address]   COLONOSCOPY PROCEDURE REPORT  PATIENT: Chad Beltran, Chad Beltran  MR#: [id number] BIRTHDATE: Feb 25, 1955 , 57  yrs. old GENDER: Male ENDOSCOPIST: Rachael Fee, MD REFERRED BY: Piedad Climes, MD PROCEDURE DATE:  01/14/2013 PROCEDURE:   Colonoscopy with biopsy and snare polypectomy First Screening Colonoscopy - Avg.  risk and is 50 yrs.  old or older Yes.  Prior Negative Screening - Now for repeat screening. N/A  History of Adenoma - Now for follow-up colonoscopy & has been > or = to 3 yrs.  N/A  Polyps Removed Today? Yes. ASA CLASS:   Class II INDICATIONS:average risk screening. MEDICATIONS: Fentanyl 75 mcg IV, Versed 8 mg IV, and These medications were titrated to patient response per physician's verbal order DESCRIPTION OF PROCEDURE:   After the risks benefits and alternatives of the procedure were thoroughly explained, informed consent was obtained.  A digital rectal exam revealed no abnormalities of the rectum.   The LB [id number]  endoscope was introduced through the anus and advanced to the cecum, which was identified by both the appendix and ileocecal valve. No adverse events experienced.   The quality of the prep was excellent.  The instrument was then slowly withdrawn as the colon was fully examined.  COLON FINDINGS: Two polyps were found, removed and both were sent to pathology.  One was sessile, 2mm across, located in ascending segment, removed with forceps.  The other was 6mm across, pedunculated, located in rectume, removed with cold snare.  There were internal and external hemorrhoids.  The examination was otherwise normal.  Retroflexed views revealed no abnormalities. The time to cecum=3 minutes 17 seconds.  Withdrawal time=12 minutes 50 seconds.  The scope was withdrawn and the procedure completed. COMPLICATIONS: There were no complications.  ENDOSCOPIC IMPRESSION: Two polyps were found,  removed and both were sent to pathology. There were internal and external hemorrhoids. The examination was otherwise normal.  RECOMMENDATIONS: If the polyp(s) removed today are proven to be adenomatous (pre-cancerous) polyps, you will need a repeat colonoscopy in 5 years.  Otherwise you should continue to follow colorectal cancer screening guidelines for "routine risk" patients with colonoscopy in 10 years.  You will receive a letter within 1-2 weeks with the results of your biopsy as well as final recommendations.  Please call my office if you have not received a letter after 3 weeks.  eSigned:  Rachael Fee, MD 01/14/2013 9:02 AM

## 2013-01-15 ENCOUNTER — Telehealth: Payer: Self-pay | Admitting: *Deleted

## 2013-01-15 NOTE — Telephone Encounter (Signed)
  Follow up Call-  Call back number 01/14/2013  Post procedure Call Back phone  # [phone] cell  Permission to leave phone message Yes     Patient questions:  Do you have a fever, pain , or abdominal swelling? no Pain Score  0 *  Have you tolerated food without any problems? yes  Have you been able to return to your normal activities? yes  Do you have any questions about your discharge instructions: Diet   no Medications  no Follow up visit  no  Do you have questions or concerns about your Care? no  Actions: * If pain score is 4 or above: No action needed, pain <4.

## 2013-01-23 ENCOUNTER — Encounter: Payer: Self-pay | Admitting: Gastroenterology

## 2014-04-23 ENCOUNTER — Emergency Department (HOSPITAL_BASED_OUTPATIENT_CLINIC_OR_DEPARTMENT_OTHER)
Admission: EM | Admit: 2014-04-23 | Discharge: 2014-04-23 | Disposition: A | Discharge status: Home / Self Care | Payer: No Typology Code available for payment source | Attending: Emergency Medicine | Admitting: Emergency Medicine

## 2014-04-23 ENCOUNTER — Encounter (HOSPITAL_BASED_OUTPATIENT_CLINIC_OR_DEPARTMENT_OTHER): Payer: Self-pay | Admitting: *Deleted

## 2014-04-23 DIAGNOSIS — X58XXXA Exposure to other specified factors, initial encounter: Secondary | ICD-10-CM | POA: Insufficient documentation

## 2014-04-23 DIAGNOSIS — Z79899 Other long term (current) drug therapy: Secondary | ICD-10-CM | POA: Insufficient documentation

## 2014-04-23 DIAGNOSIS — Y998 Other external cause status: Secondary | ICD-10-CM | POA: Insufficient documentation

## 2014-04-23 DIAGNOSIS — Y929 Unspecified place or not applicable: Secondary | ICD-10-CM | POA: Insufficient documentation

## 2014-04-23 DIAGNOSIS — Z720 Tobacco use: Secondary | ICD-10-CM | POA: Insufficient documentation

## 2014-04-23 DIAGNOSIS — Y939 Activity, unspecified: Secondary | ICD-10-CM | POA: Insufficient documentation

## 2014-04-23 DIAGNOSIS — S025XXA Fracture of tooth (traumatic), initial encounter for closed fracture: Secondary | ICD-10-CM | POA: Insufficient documentation

## 2014-04-23 MED ORDER — OXYCODONE-ACETAMINOPHEN 7.5-325 MG PO TABS: 1.0000 | ORAL_TABLET | ORAL | Status: DC | PRN

## 2014-04-23 NOTE — ED Notes (Signed)
Pt c/o dental pain x 2 days.  

## 2014-04-23 NOTE — Discharge Instructions (Signed)
Dental Fracture You have a dental fracture or injury. This can mean the tooth is loose, has a chip in the enamel or is broken. If just the outer enamel is chipped, there is a good chance the tooth will not become infected. The only treatment needed may be to smooth off a rough edge. Fractures into the deeper layers (dentin and pulp) cause greater pain and are more likely to become infected. These require you to see a dentist as soon as possible to save the tooth. Loose teeth may need to be wired or bonded with a plastic splint to hold them in place. A paste may be painted on the open area of the broken tooth to reduce the pain. Antibiotics and pain medicine may be prescribed. Choosing a soft or liquid diet and rinsing the mouth out with warm water after meals may be helpful. See your dentist as recommended. Failure to seek care or follow up with a dentist or other specialist as recommended could result in the loss of your tooth, infection, or permanent dental problems. SEEK MEDICAL CARE IF:   You have increased pain not controlled with medicines.  You have swelling around the tooth, in the face or neck.  You have bleeding which starts, continues, or gets worse.  You have a fever. Document Released: 02/10/2004 Document Revised: 03/27/2011 Document Reviewed: 11/24/2008 [facility] Patient Information 2015 [place], [place]. This information is not intended to replace advice given to you by your health care provider. Make sure you discuss any questions you have with your health care provider.    Emergency Department Resource Guide 1) Find a Doctor and Pay Out of Pocket Although you won't have to find out who is covered by your insurance plan, it is a good idea to ask around and get recommendations. You will then need to call the office and see if the doctor you have chosen will accept you as a new patient and what types of options they offer for patients who are self-pay. Some doctors offer discounts or  will set up payment plans for their patients who do not have insurance, but you will need to ask so you aren't surprised when you get to your appointment.  2) Contact Your Local Health Department Not all health departments have doctors that can see patients for sick visits, but many do, so it is worth a call to see if yours does. If you don't know where your local health department is, you can check in your phone book. The CDC also has a tool to help you locate your state's health department, and many state websites also have listings of all of their local health departments.  3) Find a [facility] If your illness is not likely to be very severe or complicated, you may want to try a walk in clinic. These are popping up all over the country in pharmacies, drugstores, and shopping centers. They're usually staffed by nurse practitioners or physician assistants that have been trained to treat common illnesses and complaints. They're usually fairly quick and inexpensive. However, if you have serious medical issues or chronic medical problems, these are probably not your best option.  No Primary Care Doctor: - Call Health Connect at  [phone] - they can help you locate a primary care doctor that  accepts your insurance, provides certain services, etc. - Physician Referral Service- [phone]  Chronic Pain Problems: Organization         Address  Phone   Notes  [facility]  [phone] Patients need to be referred by their primary care doctor.   Medication Assistance: Organization         Address  Phone   Notes  [facility] Medication [facility] [address] [place], [place] [address] [phone] --Must be a resident of [facility] -- Must have NO insurance coverage whatsoever (no Medicaid/ Medicare, etc.) -- The pt. MUST have a primary care doctor that directs their care regularly and follows them in the community   [facility]  [phone]   [employer]  [phone]    Agencies that provide inexpensive medical care: Organization         Address  Phone   Notes  [place]  [phone]   [facility]    [phone]   [facility] [place], [place] [address] [phone]   [place] [address] [place] [phone]   Planned Parenthood    [phone]   [facility]    [phone]   [place] and [place] [address], [place] Phone:  [phone], Fax:  [phone] Hours of Operation:  9 am - 6 pm, M-F.  Also accepts Medicaid/Medicare and self-pay.  [facility] for [address], [address], [place] Phone: [phone], Fax: [phone]. Hours of Operation:  8:30 am - 5:30 pm, M-F.  Also accepts Medicaid and self-pay.  [facility] [address] [place] Phone: [phone]   [place], [place], [place] [phone], Ext. [phone] Mondays & Thursdays: 7-9 AM.  First 15 patients are seen on a first come, first serve basis.    [place] Providers:  Organization         Address  Phone   Notes  [facility] [address], [place] [phone] Also accepts self-pay patients.  [facility] [address], [place]  [phone]   [place], [address], [place] [phone]   [facility] [address] [place] [phone]   Lucianne Lei [address], [place]   [phone] Only accepts [place] Access [place] patients after they have their name applied to their card.   Self-Pay (no insurance) in [facility]:  Organization         Address  Phone   Notes  Sickle Cell Patients, [facility] [place] [phone]   [facility] [place] [phone]   [facility] [place]  [place], [place], [place] [phone]   [facility]/Dr. Osei-Bonsu  [address] [place] or [address], [place] [phone] Phone number for both [place] and [place] locations is the same.  [facility] and [facility] [address] [place] [phone]   [facility] [address], [place] or [address] [phone]   [facility] [address] [place] [phone], phone; [phone], fax Sees patients 1st and 3rd Saturday of every month.  Must not qualify for public or private insurance (i.e. Medicaid, Medicare, [place]  Choice, Veterans' Benefits)  Household income should be no more than 200% of the poverty level The clinic cannot treat you if you are pregnant or think you are pregnant  Sexually transmitted diseases are not treated at the clinic.    Dental Care: Organization         Address  Phone  Notes  [facility] [place] [phone] Accepts children up to age 58 who are enrolled in [place] or [place]; pregnant women with a Medicaid card; and children who have applied for Medicaid or [place] Health Choice, but were declined, whose parents can pay a reduced fee at time of service.  [facility] Department of [facility]  [address], [place] [phone] Accepts children up to age 37 who are enrolled in [place] or [place]; pregnant women with a Medicaid card; and children who have applied for Medicaid or [place] Health Choice, but were declined, whose parents can pay a reduced fee at time of service.  [place] Adult Dental Access PROGRAM  [place] [phone] Patients are seen by appointment only. Walk-ins are not accepted. [place] will see patients 61 years of age and older. Monday - Tuesday (8am-5pm) Most Wednesdays (8:30-5pm) $30 per visit, cash only  [facility] Adult Dental Access PROGRAM  [address], [facility] [phone] Patients are seen by appointment only. Walk-ins are not accepted. [place] will see patients 83 years of age and older. One Wednesday Evening (Monthly: Volunteer Based).  $30 per visit, cash only  [place]  [phone] for adults; Children under age 98, call Graduate Pediatric Dentistry at [phone]. Children aged 14-14, please call [phone] to request a pediatric application.  Dental services are provided in all areas of dental care including fillings, crowns and bridges, complete and partial dentures, implants, gum treatment, root canals, and extractions. Preventive care is also provided. Treatment is provided to both adults and children. Patients are selected via a lottery and there is often a waiting list.   [facility] [address] [place]  [phone] [email or web address]   [facility] [address] [place], [place] [phone], Ext. [phone] Second and Fourth Thursday of each month, opens at 6:30 AM; Clinic ends at 9 AM.  Patients are seen on a first-come first-served basis, and a limited number are seen during each clinic.   [facility]  [address] [place], [place] [phone]   Eligibility Requirements You must have lived in [place], [place], or [place] counties for at least the last three months.   You cannot be eligible for state or federal sponsored [employer], including [employer], [place], or [employer].   You generally cannot be eligible for healthcare insurance through your employer.    How to apply: Eligibility screenings are held every Tuesday and Wednesday afternoon  from 1:00 pm until 4:00 pm. You do not need an appointment for the interview!  [facility] [address] [place], [place]   [place]  [place]  [place]  [phone]

## 2014-04-23 NOTE — ED Notes (Signed)
MD at bedside. 

## 2014-04-23 NOTE — ED Provider Notes (Signed)
CSN: [id number]     Arrival date & time 04/23/14  2006 History  This chart was scribed for Leonard Schwartz, MD by Steva Colder, ED Scribe. The patient was seen in room MH07/MH07 at 8:13 PM.     Chief Complaint  Patient presents with  . Dental Pain      The history is provided by the patient. No language interpreter was used.    HPI Comments: Chad Beltran is a 59 y.o. male who presents to the Emergency Department complaining of left lower dental pain onset 2 days. He has not been to see a dentist because of financial issues. He denies facial swelling and any other symptoms. 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 Pt is not allergic to any medications.    Past Medical History  Diagnosis Date  . Allergy   . Left shoulder pain    Past Surgical History  Procedure Laterality Date  . Hemorrhoid surgery    . Circumcision     Family History  Problem Relation Age of Onset  . Hypertension Mother   . Hypertension Maternal Uncle   . Colon cancer Neg Hx   . Esophageal cancer Neg Hx   . Prostate cancer Neg Hx   . Rectal cancer Neg Hx   . Stomach cancer Neg Hx    History  Substance Use Topics  . Smoking status: Current Every Day Smoker -- 0.50 packs/day for 25 years    Types: Cigarettes  . Smokeless tobacco: Never Used  . Alcohol Use: 3.6 oz/week    6 Cans of beer per week     Comment: weekends    Review of Systems  Constitutional: Negative for fever and chills.  HENT: Positive for dental problem. Negative for facial swelling.   All other systems reviewed and are negative.     Allergies  Bee venom  Home Medications   Prior to Admission medications   Medication Sig Start Date End Date Taking? Authorizing Provider  cyclobenzaprine (FLEXERIL) 10 MG tablet Take 1 tablet at bedtime for muscle spasms. 11/28/12   Brunetta Jeans, PA-C  diphenhydrAMINE (BENADRYL) 25 MG tablet Take 2 tablets (50 mg total) by mouth every  4 (four) hours as needed for itching. 11/07/12   Sherwood Gambler, MD  EPINEPHrine (EPIPEN) 0.3 mg/0.3 mL SOAJ injection Inject 0.3 mLs (0.3 mg total) into the muscle as needed. 11/07/12   Sherwood Gambler, MD  gabapentin (NEURONTIN) 100 MG capsule Take 3 capsules (300 mg total) by mouth at bedtime. 12/05/12   Lyndal Pulley, DO  ibuprofen (ADVIL,MOTRIN) 800 MG tablet Take 1 tablet (800 mg total) by mouth 3 (three) times daily. 05/03/12   Fransico Meadow, PA-C  naproxen (NAPROSYN) 500 MG tablet Take 1 tablet (500 mg total) by mouth 2 (two) times daily with a meal. 11/28/12   Brunetta Jeans, PA-C  oxyCODONE-acetaminophen (PERCOCET) 7.5-325 MG per tablet Take 1 tablet by mouth every 4 (four) hours as needed for pain. 04/23/14   Leonard Schwartz, MD  sildenafil (VIAGRA) 50 MG tablet Take 1 tablet (50 mg total) by mouth daily as needed for erectile dysfunction. 11/29/12   Brunetta Jeans, PA-C   BP 145/95 mmHg  Pulse 65  Temp(Src) 98 F (36.7 C) (Oral)  Resp 18  Ht 5\' 8"  (5.053 m)  Wt 134 lb (60.782 kg)  BMI 20.38 kg/m2  SpO2 100%  Physical Exam  Constitutional: He is oriented to person,  place, and time. He appears well-developed and well-nourished. No distress.  HENT:  Head: Normocephalic and atraumatic.  Mouth/Throat:    Eyes: Pupils are equal, round, and reactive to light.  Neck: Normal range of motion.  Cardiovascular: Normal rate and intact distal pulses.   Pulmonary/Chest: No respiratory distress.  Abdominal: Normal appearance. He exhibits no distension.  Musculoskeletal: Normal range of motion.  Neurological: He is alert and oriented to person, place, and time. No cranial nerve deficit.  Skin: Skin is warm and dry. No rash noted.  Psychiatric: He has a normal mood and affect. His behavior is normal.  Nursing note and vitals reviewed.   ED Course  Procedures (including critical care time) DIAGNOSTIC STUDIES: Oxygen Saturation is 100% on RA, nl by my interpretation.     COORDINATION OF CARE: 8:16 PM-Discussed treatment plan which includes referral to dentist, pain medication, abx with pt at bedside and pt agreed to plan.   Labs Review Labs Reviewed - No data to display  Imaging Review No results found.    MDM   Final diagnoses:  Tooth fracture, closed, initial encounter    I personally performed the services described in this documentation, which was scribed in my presence. The recorded information has been reviewed and considered.   Leonard Schwartz, MD 04/23/14 2044

## 2014-06-30 IMAGING — CR DG CERVICAL SPINE COMPLETE 4+V
6 series · 6 of 6 positions shown · non-contrast
Comparison: None.

CLINICAL DATA: Left shoulder pain for 4-5 months

EXAM:
CERVICAL SPINE  4+ VIEWS

[view not recorded (1 of 6)]
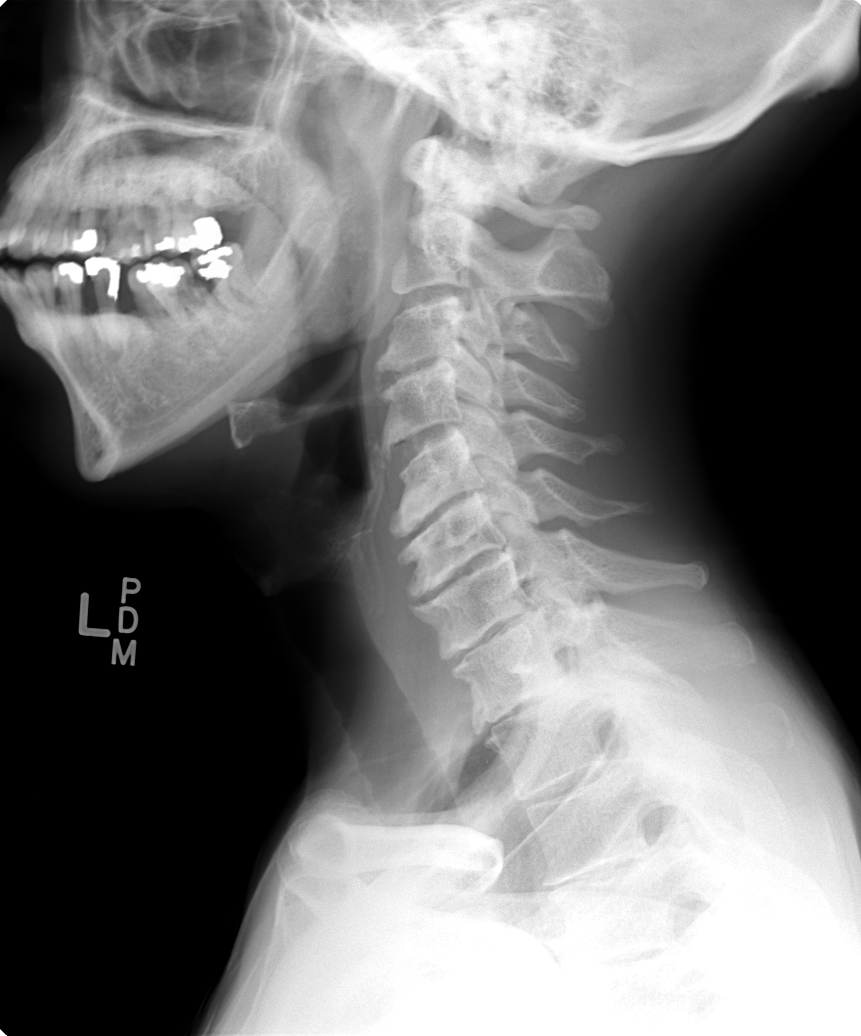

[view not recorded (2 of 6)]
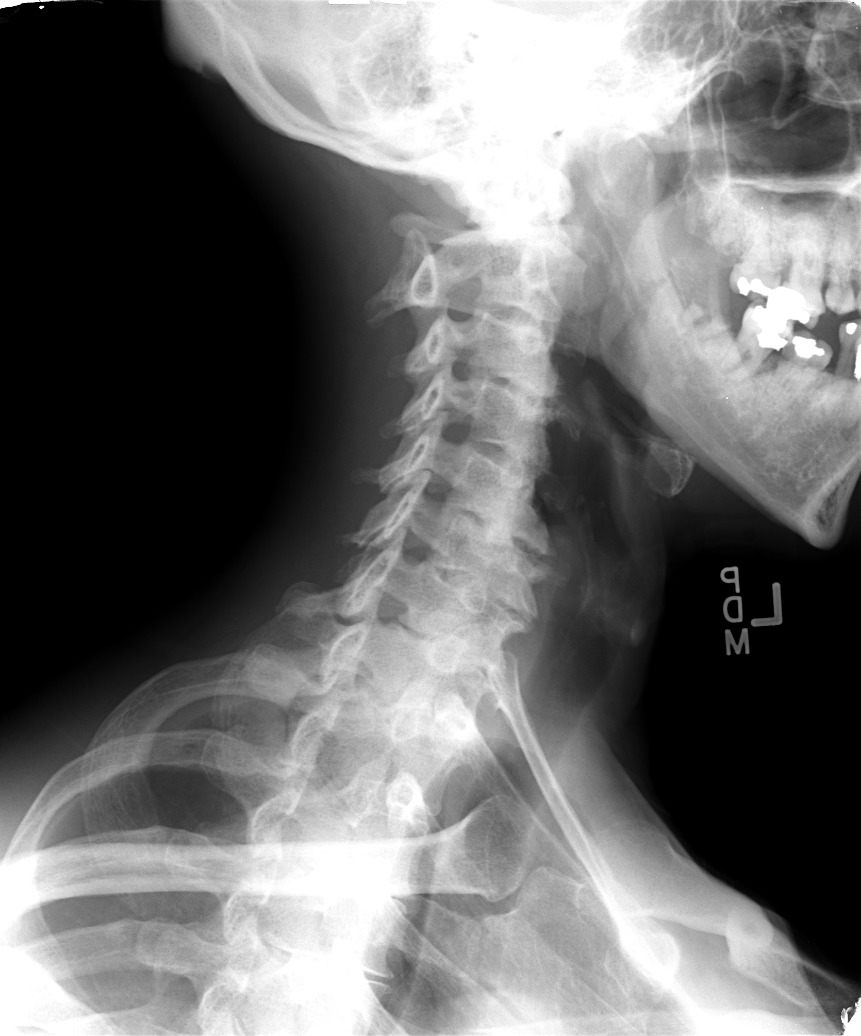

[view not recorded (3 of 6)]
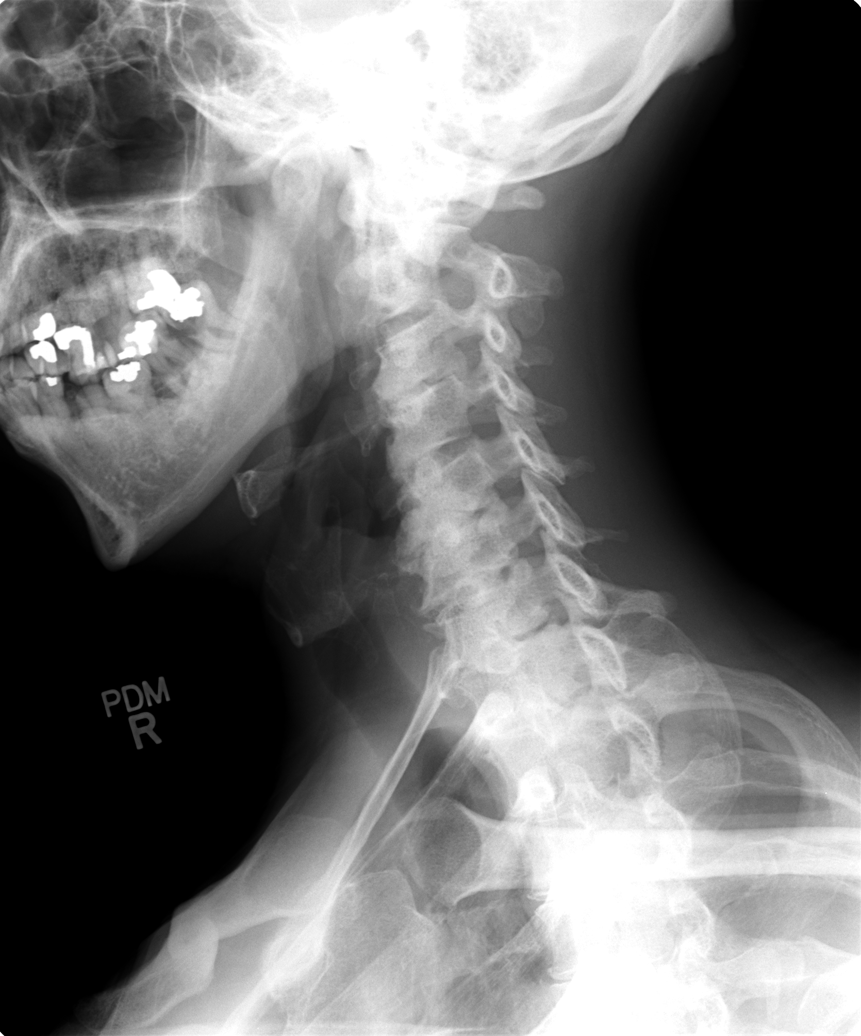

[view not recorded (4 of 6)]
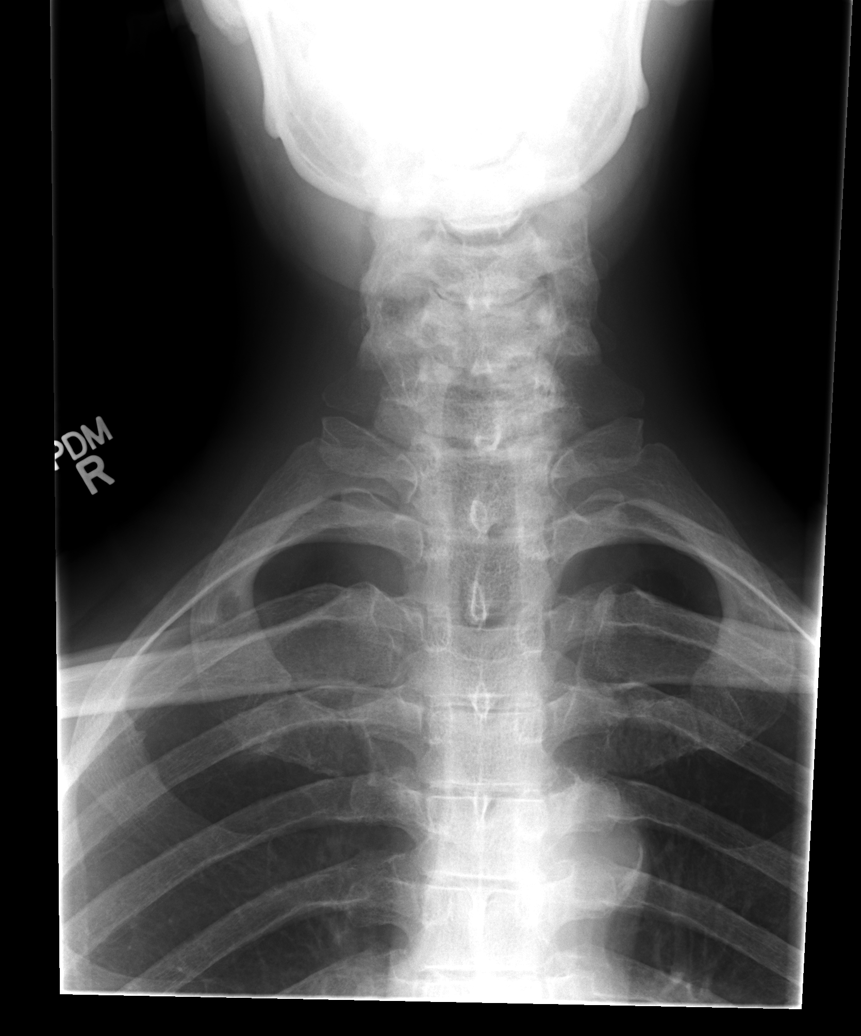

[view not recorded (5 of 6)]
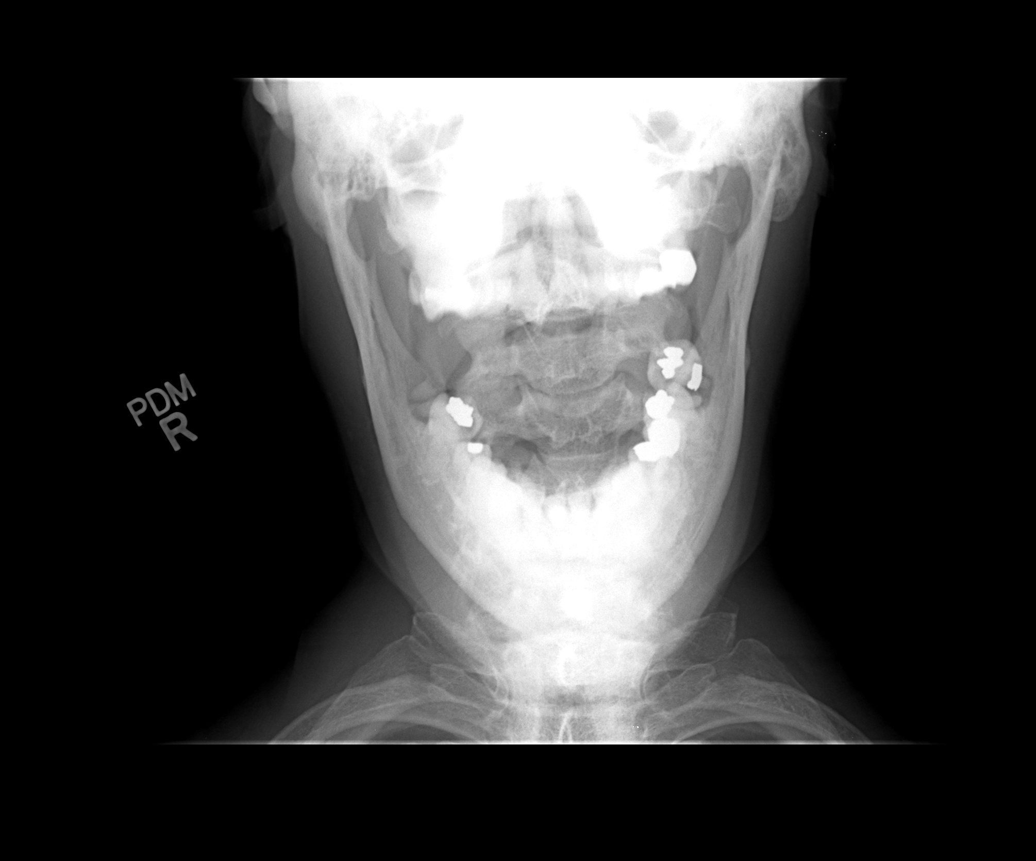

[view not recorded (6 of 6)]
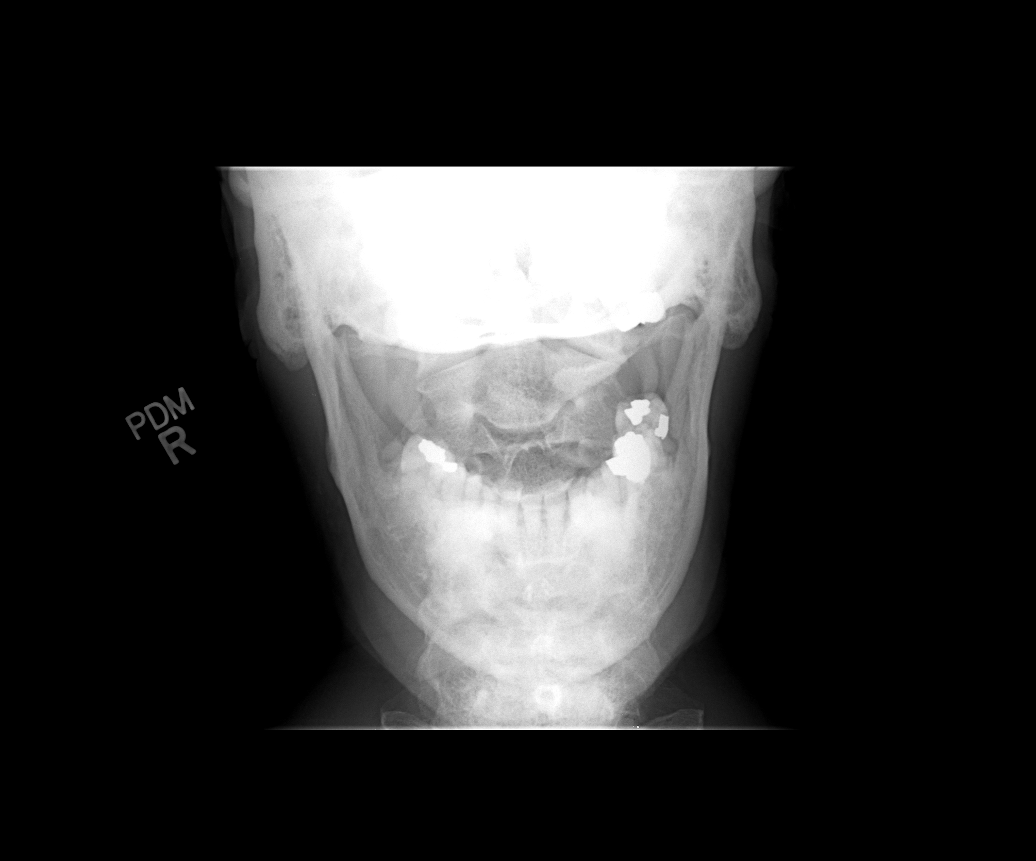

[6 of 6 positions shown; findings below may reference images not displayed]

FINDINGS: The cervical spine is visualized to the level of T1.

The vertebral body heights are maintained. The alignment is normal.
The prevertebral soft tissues are normal. There is no acute fracture
or static listhesis. There is degenerative disc disease at C3-4,
C5-6, C6-7, C7-T1 and T1-2 with disc space narrowing and discogenic
endplate osteophytes. Bilateral neural foramina are patent.
IMPRESSION: Cervical spondylosis as described above. No acute osseous injury of
the cervical spine.

## 2016-03-07 ENCOUNTER — Emergency Department (HOSPITAL_BASED_OUTPATIENT_CLINIC_OR_DEPARTMENT_OTHER)
Admission: EM | Admit: 2016-03-07 | Discharge: 2016-03-07 | Disposition: A | Discharge status: Home / Self Care | Payer: 59 | Attending: Emergency Medicine | Admitting: Emergency Medicine

## 2016-03-07 ENCOUNTER — Encounter (HOSPITAL_BASED_OUTPATIENT_CLINIC_OR_DEPARTMENT_OTHER): Payer: Self-pay

## 2016-03-07 DIAGNOSIS — R21 Rash and other nonspecific skin eruption: Secondary | ICD-10-CM | POA: Diagnosis not present

## 2016-03-07 DIAGNOSIS — G8929 Other chronic pain: Secondary | ICD-10-CM

## 2016-03-07 DIAGNOSIS — Z79899 Other long term (current) drug therapy: Secondary | ICD-10-CM | POA: Insufficient documentation

## 2016-03-07 DIAGNOSIS — F17210 Nicotine dependence, cigarettes, uncomplicated: Secondary | ICD-10-CM | POA: Insufficient documentation

## 2016-03-07 DIAGNOSIS — Z202 Contact with and (suspected) exposure to infections with a predominantly sexual mode of transmission: Secondary | ICD-10-CM | POA: Diagnosis present

## 2016-03-07 DIAGNOSIS — M545 Low back pain, unspecified: Secondary | ICD-10-CM

## 2016-03-07 HISTORY — DX: Dorsalgia, unspecified: M54.9

## 2016-03-07 LAB — URINALYSIS, ROUTINE W REFLEX MICROSCOPIC
BILIRUBIN URINE: NEGATIVE
Glucose, UA: NEGATIVE mg/dL
HGB URINE DIPSTICK: NEGATIVE
KETONES UR: NEGATIVE mg/dL
Leukocytes, UA: NEGATIVE
Nitrite: NEGATIVE
PROTEIN: NEGATIVE mg/dL
Specific Gravity, Urine: 1.014 (ref 1.005–1.030)
pH: 6 (ref 5.0–8.0)

## 2016-03-07 MED ORDER — ACETAMINOPHEN-CODEINE #3 300-30 MG PO TABS: 1.0000 | ORAL_TABLET | Freq: Four times a day (QID) | ORAL | 0 refills | Status: DC | PRN

## 2016-03-07 MED ORDER — BACLOFEN 20 MG PO TABS: 20.0000 mg | ORAL_TABLET | Freq: Three times a day (TID) | ORAL | 0 refills | Status: AC

## 2016-03-07 MED ORDER — MELOXICAM 15 MG PO TABS: 15.0000 mg | ORAL_TABLET | Freq: Every day | ORAL | 0 refills | Status: DC

## 2016-03-07 NOTE — ED Provider Notes (Signed)
[facility] [facility] Provider Note   CSN: [id number] Arrival date & time: 03/07/16  1410  By signing my name below, I, Evelene Croon, attest that this documentation has been prepared under the direction and in the presence of Margarita Mail, PA-C. Electronically Signed: Evelene Croon, Scribe. 03/07/2016. 5:24 PM.  History   Chief Complaint Chief Complaint  Patient presents with  . Back Pain  . Exposure to STD    The history is provided by the patient. No language interpreter was used.     HPI Comments:  Chad Beltran is a 61 y.o. male who presents to the Emergency Department for STD check following possible STD exposure. He states his lady friend informed him that she was recently diagnosed with herpes.  He denies dysuia, testicular pain, penile pain, penile discharge and penile rash but notes pruritic rash to the left inner thigh area.  He is also complaining of increased chronic back pain. He has been evaluated by a sports medicine doctor in the past. He has been taking Aleve but stopped taking it due to stomach upset. No recent injury or fall. No weakness in his lower extremities. Pt is a current smoker.   NK[facility] No PCP  Past Medical History:  Diagnosis Date  . Allergy   . Back pain   . Left shoulder pain     Patient Active Problem List   Diagnosis Date Noted  . Cervical disc disorder with radiculopathy of cervical region 12/05/2012  . Visit for preventive health examination 11/29/2012  . Pain in joint, shoulder region 11/29/2012  . Neck pain on left side 11/29/2012  . Erectile dysfunction 11/29/2012    Past Surgical History:  Procedure Laterality Date  . CIRCUMCISION    . HEMORRHOID SURGERY         Home Medications    Prior to Admission medications   Medication Sig Start Date End Date Taking? Authorizing Provider  cyclobenzaprine (FLEXERIL) 10 MG tablet Take 1 tablet at bedtime for muscle spasms. 11/28/12   Brunetta Jeans, PA-C  diphenhydrAMINE  (BENADRYL) 25 MG tablet Take 2 tablets (50 mg total) by mouth every 4 (four) hours as needed for itching. 11/07/12   Sherwood Gambler, MD  EPINEPHrine (EPIPEN) 0.3 mg/0.3 mL SOAJ injection Inject 0.3 mLs (0.3 mg total) into the muscle as needed. 11/07/12   Sherwood Gambler, MD  gabapentin (NEURONTIN) 100 MG capsule Take 3 capsules (300 mg total) by mouth at bedtime. 12/05/12   Lyndal Pulley, DO  ibuprofen (ADVIL,MOTRIN) 800 MG tablet Take 1 tablet (800 mg total) by mouth 3 (three) times daily. 05/03/12   Fransico Meadow, PA-C  naproxen (NAPROSYN) 500 MG tablet Take 1 tablet (500 mg total) by mouth 2 (two) times daily with a meal. 11/28/12   Brunetta Jeans, PA-C  oxyCODONE-acetaminophen (PERCOCET) 7.5-325 MG per tablet Take 1 tablet by mouth every 4 (four) hours as needed for pain. 04/23/14   Leonard Schwartz, MD  sildenafil (VIAGRA) 50 MG tablet Take 1 tablet (50 mg total) by mouth daily as needed for erectile dysfunction. 11/29/12   Brunetta Jeans, PA-C    Family History Family History  Problem Relation Age of Onset  . Hypertension Mother   . Hypertension Maternal Uncle   . Colon cancer Neg Hx   . Esophageal cancer Neg Hx   . Prostate cancer Neg Hx   . Rectal cancer Neg Hx   . Stomach cancer Neg Hx     Social History Social History  Substance Use Topics  . Smoking status: Current Every Day Smoker    Packs/day: 0.50    Years: 25.00    Types: Cigarettes  . Smokeless tobacco: Never Used  . Alcohol use 3.6 oz/week    6 Cans of beer per week     Comment: weekends     Allergies   Bee venom   Review of Systems Review of Systems  Constitutional: Negative for fever.  Genitourinary: Negative for discharge, dysuria, hematuria, penile pain and scrotal swelling.  Musculoskeletal: Positive for back pain.  Skin: Positive for rash.  Neurological: Negative for weakness.     Physical Exam Updated Vital Signs BP 141/85 (BP Location: Right Arm)   Pulse 83   Temp 98.2 F (36.8 C) (Oral)    Resp 18   Ht 5\' 8"  (1.727 m)   Wt 135 lb (61.2 kg)   SpO2 100%   BMI 20.53 kg/m   Physical Exam  Constitutional: He is oriented to person, place, and time. He appears well-developed and well-nourished. No distress.  HENT:  Head: Normocephalic and atraumatic.  Eyes: Conjunctivae are normal.  Cardiovascular: Normal rate.   Pulmonary/Chest: Effort normal.  Abdominal: He exhibits no distension.  Genitourinary: Testes normal and penis normal. Circumcised. No discharge found.  Genitourinary Comments: No discharge or testicular pain   Chaperone (scribe) was present for exam which was performed with no discomfort or complications.   Musculoskeletal: Normal range of motion.  Bilateral low back pain muscular worse on the left Pain with extension of back, and twisting  Nml strength in BLE   Neurological: He is alert and oriented to person, place, and time.  Skin: Skin is warm and dry.  Psychiatric: He has a normal mood and affect.  Nursing note and vitals reviewed.    ED Treatments / Results  DIAGNOSTIC STUDIES:  Oxygen Saturation is 100% on RA, normal by my interpretation.    COORDINATION OF CARE:  5:14 PM Discussed treatment plan with pt at bedside and pt agreed to plan.  Labs (all labs ordered are listed, but only abnormal results are displayed) Labs Reviewed  RPR  HIV ANTIBODY (ROUTINE TESTING)  URINALYSIS, ROUTINE W REFLEX MICROSCOPIC  GC/CHLAMYDIA PROBE AMP ([place]) NOT AT [facility]    EKG  EKG Interpretation None       Radiology No results found.  Procedures Procedures (including critical care time)  Medications Ordered in ED Medications - No data to display   Initial Impression / Assessment and Plan / ED Course  I have reviewed the triage vital signs and the nursing notes.  Pertinent labs & imaging results that were available during my care of the patient were reviewed by me and considered in my medical decision making (see chart for  details).  Clinical Course as of Mar 07 1949  Tue Mar 07, 2016  1825 Reviewed [facility]- no meds  [AH]    Clinical Course User Index [AH] Margarita Mail, PA-C    Patient with back pain, consider acute exacerbation of his chronic pain. We'll discharge with treatment for pain. Patient also has potential exposure to STD. Denies he any symptoms with him today. the patient will be tested for GC chlamydia, HIV, RPR. Discussed that these will return in the next 2-3 days. Pierce safe for discharge at this time. Advise follow-up with PCP  Final Clinical Impressions(s) / ED Diagnoses   Final diagnoses:  None    New Prescriptions New Prescriptions   No medications on file   I  personally performed the services described in this documentation, which was scribed in my presence. The recorded information has been reviewed and is accurate.        Margarita Mail, PA-C 03/07/16 1952    Dorie Rank, MD 03/09/16 2056

## 2016-03-07 NOTE — Discharge Instructions (Signed)
SEEK IMMEDIATE MEDICAL ATTENTION IF: New numbness, tingling, weakness, or problem with the use of your arms or legs.  Severe back pain not relieved with medications.  Change in bowel or bladder control.  Increasing pain in any areas of the body (such as chest or abdominal pain).  Shortness of breath, dizziness or fainting.  Nausea (feeling sick to your stomach), vomiting, fever, or sweats.  

## 2016-03-07 NOTE — ED Triage Notes (Signed)
Reports chronic back pain he wants to get checked for but also reports his "lady friend has an STD and wants to get checked to".  Denies symptoms.

## 2016-03-08 LAB — HIV ANTIBODY (ROUTINE TESTING W REFLEX): HIV SCREEN 4TH GENERATION: NONREACTIVE

## 2016-03-08 LAB — RPR: RPR: NONREACTIVE

## 2016-03-08 LAB — GC/CHLAMYDIA PROBE AMP (~~LOC~~) NOT AT ARMC
Chlamydia: NEGATIVE
Neisseria Gonorrhea: NEGATIVE

## 2018-01-06 ENCOUNTER — Encounter: Payer: Self-pay | Admitting: Gastroenterology

## 2022-04-23 ENCOUNTER — Emergency Department (HOSPITAL_BASED_OUTPATIENT_CLINIC_OR_DEPARTMENT_OTHER)
Admission: EM | Admit: 2022-04-23 | Discharge: 2022-04-23 | Disposition: A | Discharge status: Home / Self Care | Payer: Medicare HMO | Attending: Emergency Medicine | Admitting: Emergency Medicine

## 2022-04-23 ENCOUNTER — Encounter (HOSPITAL_BASED_OUTPATIENT_CLINIC_OR_DEPARTMENT_OTHER): Payer: Self-pay | Admitting: Emergency Medicine

## 2022-04-23 ENCOUNTER — Emergency Department (HOSPITAL_BASED_OUTPATIENT_CLINIC_OR_DEPARTMENT_OTHER): Payer: Medicare HMO

## 2022-04-23 DIAGNOSIS — W208XXA Other cause of strike by thrown, projected or falling object, initial encounter: Secondary | ICD-10-CM | POA: Diagnosis not present

## 2022-04-23 DIAGNOSIS — S99921A Unspecified injury of right foot, initial encounter: Secondary | ICD-10-CM | POA: Diagnosis present

## 2022-04-23 DIAGNOSIS — M79671 Pain in right foot: Secondary | ICD-10-CM

## 2022-04-23 DIAGNOSIS — S9781XA Crushing injury of right foot, initial encounter: Secondary | ICD-10-CM | POA: Diagnosis not present

## 2022-04-23 HISTORY — DX: Essential (primary) hypertension: I10

## 2022-04-23 MED ORDER — ACETAMINOPHEN 500 MG PO TABS
500.0000 mg | ORAL_TABLET | Freq: Four times a day (QID) | ORAL | 0 refills | Status: DC | PRN
  Filled 2022-04-23: qty 30, 8d supply, fill #0

## 2022-04-23 MED ORDER — NAPROXEN 375 MG PO TABS: 375.0000 mg | ORAL_TABLET | Freq: Two times a day (BID) | ORAL | 0 refills | Status: AC

## 2022-04-23 MED ORDER — OXYCODONE-ACETAMINOPHEN 5-325 MG PO TABS
1.0000 | ORAL_TABLET | Freq: Once | ORAL | Status: AC
  Administered 2022-04-23: 1 via ORAL
  Filled 2022-04-23: qty 1

## 2022-04-23 MED ORDER — NAPROXEN 375 MG PO TABS
375.0000 mg | ORAL_TABLET | Freq: Two times a day (BID) | ORAL | 0 refills | Status: DC
  Filled 2022-04-23: qty 10, 5d supply, fill #0

## 2022-04-23 MED ORDER — ACETAMINOPHEN 500 MG PO TABS: 500.0000 mg | ORAL_TABLET | Freq: Four times a day (QID) | ORAL | 0 refills | Status: AC | PRN

## 2022-04-23 NOTE — Discharge Instructions (Addendum)
You are seen in the ER for foot pain.  The x-ray is negative for any fracture. We recommend that you ice the area of injury.  Take the medications prescribed for symptom management. Try not to put weight on your feet.  If your symptoms do not get better in the next week, call the orthopedic doctor at the number provided to set up an appointment.

## 2022-04-23 NOTE — ED Triage Notes (Addendum)
Pt dropped something on RT pinky toe one week ago; c/o worsening pain and swelling

## 2022-04-24 ENCOUNTER — Other Ambulatory Visit (HOSPITAL_BASED_OUTPATIENT_CLINIC_OR_DEPARTMENT_OTHER): Payer: Self-pay

## 2022-04-24 NOTE — ED Provider Notes (Signed)
[facility] [facility]GENC[facility]PARTMENT AT [facility] Provider Note   CSN: [id number] Arrival date & time: 04/23/22  1138     History  Chief Complaint  Patient presents with   Toe Injury    Chad Beltran is a 67 y.o. male.  HPI    67 year old male comes in with chief complaint of toe injury.  Patient indicates that he dropped a 15 to 20 pound object onto his right foot a week ago.  Initially he has significant pain, but symptoms improved.  Over the last few days he has started noticing worsening pain and swelling.  He has difficulty putting his shoes on.  Home Medications Prior to Admission medications   Medication Sig Start Date End Date Taking? Authorizing Provider  acetaminophen (TYLENOL) 500 MG tablet Take 1 tablet (500 mg total) by mouth every 6 (six) hours as needed. 04/23/22   Derwood Kaplan, MD  baclofen (LIORESAL) 20 MG tablet Take 1 tablet (20 mg total) by mouth 3 (three) times daily. 03/07/16   Arthor Captain, PA-C  cyclobenzaprine (FLEXERIL) 10 MG tablet Take 1 tablet at bedtime for muscle spasms. 11/28/12   Waldon Merl, PA-C  diphenhydrAMINE (BENADRYL) 25 MG tablet Take 2 tablets (50 mg total) by mouth every 4 (four) hours as needed for itching. 11/07/12   Pricilla Loveless, MD  EPINEPHrine (EPIPEN) 0.3 mg/0.3 mL SOAJ injection Inject 0.3 mLs (0.3 mg total) into the muscle as needed. 11/07/12   Pricilla Loveless, MD  gabapentin (NEURONTIN) 100 MG capsule Take 3 capsules (300 mg total) by mouth at bedtime. 12/05/12   Judi Saa, DO  naproxen (NAPROSYN) 375 MG tablet Take 1 tablet (375 mg total) by mouth 2 (two) times daily with a meal. 04/23/22   Derwood Kaplan, MD  sildenafil (VIAGRA) 50 MG tablet Take 1 tablet (50 mg total) by mouth daily as needed for erectile dysfunction. 11/29/12   Waldon Merl, PA-C      Allergies    Bee venom and Shellfish-derived products    Review of Systems   Review of Systems  Physical Exam Updated Vital Signs BP (!)  174/90   Pulse 71   Temp 98.2 F (36.8 C) (Oral)   Resp 18   Ht 5\' 8"  (1.727 m)   Wt 61.7 kg   SpO2 100%   BMI 20.68 kg/m  Physical Exam Vitals and nursing note reviewed.  Constitutional:      Appearance: He is well-developed.  HENT:     Head: Atraumatic.  Cardiovascular:     Rate and Rhythm: Normal rate.  Pulmonary:     Effort: Pulmonary effort is normal.  Musculoskeletal:        General: Swelling and tenderness present. No deformity.     Cervical back: Neck supple.     Comments: Patient has tenderness over the fourth toe and distal foot just proximal to the fourth toe.  Skin:    General: Skin is warm.  Neurological:     Mental Status: He is alert and oriented to person, place, and time.     ED Results / Procedures / Treatments   Labs (all labs ordered are listed, but only abnormal results are displayed) Labs Reviewed - No data to display  EKG None  Radiology DG Foot Complete Right  Result Date: 04/23/2022 CLINICAL DATA:  distal foot and 4th digit proximal toe pain EXAM: RIGHT FOOT COMPLETE - 3+ VIEW COMPARISON:  Same day radiograph FINDINGS: No acute fracture or dislocation. Joint spaces  and alignment are maintained. No area of erosion or osseous destruction. No unexpected radiopaque foreign body. Vascular calcifications. IMPRESSION: No acute fracture or dislocation. If persistent clinical concern, consider immobilization with repeat radiograph in 2 weeks. Electronically Signed   By: Meda Klinefelter M.D.   On: 04/23/2022 14:40   DG Toe 5th Right  Result Date: 04/23/2022 CLINICAL DATA:  injury EXAM: RIGHT FIFTH TOE COMPARISON:  None Available. FINDINGS: No acute fracture or dislocation. Scattered degenerative changes of the DIP and PIPs. No area of erosion or osseous destruction. No unexpected radiopaque foreign body. Mild soft tissue edema. IMPRESSION: No acute fracture or dislocation. Electronically Signed   By: Meda Klinefelter M.D.   On: 04/23/2022 12:28     Procedures Procedures    Medications Ordered in ED Medications  oxyCODONE-acetaminophen (PERCOCET/ROXICET) 5-325 MG per tablet 1 tablet (1 tablet Oral Given 04/23/22 1419)    ED Course/ Medical Decision Making/ A&P                             Medical Decision Making Amount and/or Complexity of Data Reviewed Radiology: ordered.  Risk OTC drugs. Prescription drug management.   68 year old male comes in with chief complaint of toe injury.  On exam, patient has tenderness over the proximal aspect of fourth toe and also the area of foot just proximal to the toe.  From the triage, did order x-ray of the fifth toe.  I will order x-ray of the foot given the significant tenderness over the distal foot.  Reassessment: Differential diagnosis for this patient includes toe fracture, contusion, soft tissue injury. X-ray independently interpreted.  There is no evidence of fracture.  We will put him in a postop shoe.  Patient advised to follow-up with orthopedist if not getting better.  RICE treatment recommended.  He states that he can get crutches from somebody else.  Final Clinical Impression(s) / ED Diagnoses Final diagnoses:  Right foot pain  Crushing injury of right foot, initial encounter    Rx / DC Orders ED Discharge Orders          Ordered    acetaminophen (TYLENOL) 500 MG tablet  Every 6 hours PRN,   Status:  Discontinued        04/23/22 1457    naproxen (NAPROSYN) 375 MG tablet  2 times daily with meals,   Status:  Discontinued        04/23/22 1457    acetaminophen (TYLENOL) 500 MG tablet  Every 6 hours PRN        04/23/22 1502    naproxen (NAPROSYN) 375 MG tablet  2 times daily with meals        04/23/22 1502              Derwood Kaplan, MD 04/24/22 [phone]

## 2023-01-17 DEATH — deceased
